# Patient Record
Sex: Male | Born: 2000 | Race: Black or African American | Hispanic: No | Marital: Single | Smoking: Current some day smoker
Health system: Southern US, Community
[De-identification: ages and names within clinical notes are randomized; demographics above are authoritative.]

## PROBLEM LIST (undated history)

## (undated) DIAGNOSIS — F909 Attention-deficit hyperactivity disorder, unspecified type: Secondary | ICD-10-CM

## (undated) HISTORY — PX: MOUTH SURGERY: SHX715

---
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
## 2020-01-16 DIAGNOSIS — S66115A Strain of flexor muscle, fascia and tendon of left ring finger at wrist and hand level, initial encounter: Secondary | ICD-10-CM | POA: Diagnosis not present

## 2020-03-30 ENCOUNTER — Ambulatory Visit: Payer: PPO | Attending: Orthopedic Surgery | Admitting: Occupational Therapy

## 2020-03-30 DIAGNOSIS — M25542 Pain in joints of left hand: Secondary | ICD-10-CM | POA: Insufficient documentation

## 2020-03-30 DIAGNOSIS — M25642 Stiffness of left hand, not elsewhere classified: Secondary | ICD-10-CM | POA: Insufficient documentation

## 2020-03-30 DIAGNOSIS — M6281 Muscle weakness (generalized): Secondary | ICD-10-CM | POA: Insufficient documentation

## 2020-03-30 DIAGNOSIS — R278 Other lack of coordination: Secondary | ICD-10-CM | POA: Insufficient documentation

## 2020-03-31 ENCOUNTER — Ambulatory Visit: Payer: PPO | Admitting: Occupational Therapy

## 2020-03-31 ENCOUNTER — Other Ambulatory Visit: Payer: Self-pay

## 2020-03-31 DIAGNOSIS — R278 Other lack of coordination: Secondary | ICD-10-CM

## 2020-03-31 DIAGNOSIS — M25542 Pain in joints of left hand: Secondary | ICD-10-CM | POA: Diagnosis present

## 2020-03-31 DIAGNOSIS — M25642 Stiffness of left hand, not elsewhere classified: Secondary | ICD-10-CM

## 2020-03-31 DIAGNOSIS — M6281 Muscle weakness (generalized): Secondary | ICD-10-CM | POA: Diagnosis present

## 2020-03-31 NOTE — Therapy (Signed)
[facility] [address] [place], [place], [address] Phone: [phone]   Fax:  [phone]  Occupational Therapy Evaluation  Patient Details  Name: Philip Lopez MRN: [id number] Date of Birth: 2000/12/20 Referring Provider (OT): Dr. Melvyn Novas   Encounter Date: 03/31/2020   OT End of Session - 03/31/20 1532    Visit Number 1    Number of Visits 16    Date for OT Re-Evaluation 05/29/20    Authorization Type [employer] MCD    OT Start Time 1320    OT Stop Time 1440    OT Time Calculation (min) 80 min    Activity Tolerance Patient tolerated treatment well    Behavior During Therapy [facility] for tasks assessed/performed           Past Medical History:  Diagnosis Date  . ADHD (attention deficit hyperactivity disorder)     Past Surgical History:  Procedure Laterality Date  . CIRCUMCISION  07/2016  . MOUTH SURGERY      There were no vitals filed for this visit.   Subjective Assessment - 03/31/20 1521    Pertinent History Lt ring FDP repair 03/15/20    Limitations per protocol    Currently in Pain? Yes    Pain Score 5     Pain Location Hand    Pain Orientation Left    Pain Descriptors / Indicators Sore    Pain Type Surgical pain;Acute pain    Pain Onset 1 to 4 weeks ago    Pain Frequency Intermittent    Aggravating Factors  movement    Pain Relieving Factors rest             [facility] OT Assessment - 03/31/20 0001      Assessment   Medical Diagnosis Lt ring FDP repair    Referring Provider (OT) Dr. Melvyn Novas    Onset Date/Surgical Date 03/15/20    Hand Dominance Right    Prior Therapy none      Precautions   Precautions Other (comment)    Precaution Comments per protocol    Required Braces or Orthoses Other Brace/Splint    Other Brace/Splint Dorsal block splint (DBS)      Home  Environment   Additional Comments Pt lives with roommate and attends [employer] but family close by    Lives With --   Roomate      Prior Function   Level of Independence Independent    Warden/ranger      ADL   Eating/Feeding Needs assist with cutting food    Grooming Set up    Upper Body Bathing Minimal assistance    Lower Body Bathing Minimal assistance    Upper Body Dressing Increased time    Lower Body Dressing Increased time    Toilet Transfer Independent    ADL comments has voice recognition software for typing      Written Expression   Dominant Hand Right    Handwriting --   no changes     Edema   Edema mild at Lt ring finger      ROM / Strength   AROM / PROM / Strength PROM      PROM   Overall PROM Comments WFL's w/ PROM in confines of splint                    OT Treatments/Exercises (OP) - 03/31/20 0001      ADLs   ADL Comments Pt arrived fully wrapped  and protected. Carefully removed soft cast and gauze wrapping (stitches removed yesterday at MD office). Cleaned hand/forearm and dried fully while preventing extension at MP's. Pt instructed in hand hygiene, precautions, and splint wear and care.      Exercises   Exercises --   Initial HEP within confines of splint - passive flexion and active extension     Splinting   Splinting Fabricated and fitted DBS per protocol with slight wrist extension, MP's at approx 60* flexion and IP's straight. Issued splint                 OT Education - 03/31/20 1424    Education Details hand hygiene, splint wear and care, precautions, initial HEP within confines of splint    Person(s) Educated Patient    Methods Explanation;Demonstration;Handout    Comprehension Verbalized understanding;Returned demonstration            OT Short Term Goals - 03/31/20 1537      OT SHORT TERM GOAL #1   Title Independent with splint wear and care    Baseline issued, may need adjustments    Time 4    Period Weeks    Status On-going      OT SHORT TERM GOAL #2   Title Independent with initial HEP    Baseline issued, will need updates per  protocol    Time 4    Period Weeks    Status On-going      OT SHORT TERM GOAL #3   Title Pt to demo approx 75% active composite flexion Rt ring finger    Baseline unable due to current precautions    Time 4    Period Weeks    Status New             OT Long Term Goals - 03/31/20 1539      OT LONG TERM GOAL #1   Title Independent with strengthening HEP    Baseline Dependent at this time d/t current precautions    Time 8    Period Weeks    Status New      OT LONG TERM GOAL #2   Title Pt to demo 90% full composite flexion Lt ring finger    Baseline dependent d/t current precautions    Time 8    Period Weeks    Status New      OT LONG TERM GOAL #3   Title Pt to use Lt hand as assist for all light bilateral tasks including typing    Baseline unable    Time 8    Period Weeks    Status New      OT LONG TERM GOAL #4   Title Pt to demo 30 lbs or greater grip strength Lt hand to assist with opening jars/containers    Baseline dependent d/t current precautions    Time 8    Period Weeks    Status New                 Plan - 03/31/20 1534    Clinical Impression Statement Pt is a 20 y.o. male who presents to OPOT s/p FDP repair of Lt ring finger on 03/15/20. Pt arrived fully wrapped and protected for evaluation, splinting, and initiation of HEP within confines of splint per protocol. Pt would benefit from O.T. to address deficits in ROM, pain, edema, coordination, and strength and improve functional use of Lt hand.    OT Occupational Profile and History Problem Focused Assessment -  Including review of records relating to presenting problem    Occupational performance deficits (Please refer to evaluation for details): ADL's;IADL's;Education    Body Structure / Function / Physical Skills ADL;Strength;Decreased knowledge of use of DME;Pain;Edema;UE functional use;IADL;ROM;Scar mobility;Coordination;Sensation;FMC;Decreased knowledge of precautions    Rehab Potential Good     Clinical Decision Making Several treatment options, min-mod task modification necessary    Comorbidities Affecting Occupational Performance: None    Modification or Assistance to Complete Evaluation  Min-Moderate modification of tasks or assist with assess necessary to complete eval    OT Frequency 2x / week    OT Duration 8 weeks    OT Treatment/Interventions Self-care/ADL training;Moist Heat;Fluidtherapy;DME and/or AE instruction;Splinting;Therapeutic activities;Compression bandaging;Ultrasound;Therapeutic exercise;Scar mobilization;Cryotherapy;Passive range of motion;Electrical Stimulation;Manual Therapy;Patient/family education    Plan progress as able per protocol, at 3 1/2 weeks post op add active flexion within splint    Consulted and Agree with Plan of Care Patient           Patient will benefit from skilled therapeutic intervention in order to improve the following deficits and impairments:   Body Structure / Function / Physical Skills: ADL,Strength,Decreased knowledge of use of DME,Pain,Edema,UE functional use,IADL,ROM,Scar mobility,Coordination,Sensation,FMC,Decreased knowledge of precautions     Managed medicaid CPT codes: 56433- Therapeutic Exercise, 97140 - Manual Therapy, 97530 - Therapeutic Activities, 97535 - Self Care, 97014 - Electrical stimulation (unattended), [id number] - Ultrasound, [id number] - Orthotic Fit, [id number] - Fluidotherapy, [id number] - Contrast bath and [id number] -  Paraffin     Visit Diagnosis: Stiffness of finger joint of left hand  Pain in joint of left hand  Muscle weakness (generalized)  Other lack of coordination    Problem List Patient Active Problem List   Diagnosis Date Noted  . STUTTERING 09/11/2009  . 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

## 2020-03-31 NOTE — Patient Instructions (Signed)
  WEARING SCHEDULE:  Wear splint at ALL times except for hygiene care (May remove finger strap only for exercises and then immediately place back on ONLY if directed by the therapist)  PURPOSE:  To prevent movement and for protection until injury can heal  CARE OF SPLINT:  Keep splint away from heat sources including: stove, radiator or furnace, or a car in sunlight. The splint can melt and will no longer fit you properly  Keep away from pets and children  Clean the splint with rubbing alcohol 1-2 times per day.  * During this time, make sure you also clean your hand/arm as instructed by your therapist and/or perform dressing changes as needed. Then dry hand/arm completely before replacing splint. (When cleaning hand/arm, keep it immobilized in same position until splint is replaced)  PRECAUTIONS/POTENTIAL PROBLEMS: *If you notice or experience increased pain, swelling, numbness, or a lingering reddened area from the splint: Contact your therapist immediately by calling [phone]. You must wear the splint for protection, but we will get you scheduled for adjustments as quickly as possible.  (If only straps or hooks need to be replaced and NO adjustments to the splint need to be made, just call the office ahead and let them know you are coming in)  If you have any medical concerns or signs of infection, please call your doctor immediately

## 2020-04-07 ENCOUNTER — Ambulatory Visit: Payer: PPO | Attending: Orthopedic Surgery | Admitting: Occupational Therapy

## 2020-04-07 ENCOUNTER — Other Ambulatory Visit: Payer: Self-pay

## 2020-04-07 DIAGNOSIS — M25542 Pain in joints of left hand: Secondary | ICD-10-CM | POA: Insufficient documentation

## 2020-04-07 DIAGNOSIS — M25642 Stiffness of left hand, not elsewhere classified: Secondary | ICD-10-CM | POA: Insufficient documentation

## 2020-04-07 NOTE — Therapy (Signed)
[facility] [address] [place], [place], [address] Phone: [phone]   Fax:  [phone]  Occupational Therapy Treatment  Patient Details  Name: Georgi Navarrete MRN: [id number] Date of Birth: 02/23/2001 Referring Provider (OT): Dr. Melvyn Novas   Encounter Date: 04/07/2020   OT End of Session - 04/07/20 1203    Visit Number 2    Number of Visits 16    Date for OT Re-Evaluation 05/29/20    Authorization Type [employer] MCD    OT Start Time 1100    OT Stop Time 1140    OT Time Calculation (min) 40 min    Activity Tolerance Patient tolerated treatment well    Behavior During Therapy [facility] for tasks assessed/performed           Past Medical History:  Diagnosis Date  . ADHD (attention deficit hyperactivity disorder)     Past Surgical History:  Procedure Laterality Date  . CIRCUMCISION  07/2016  . MOUTH SURGERY      There were no vitals filed for this visit.   Subjective Assessment - 04/07/20 1106    Pertinent History Lt ring FDP repair with grafting (using palmaris longus) 03/15/20    Limitations per protocol    Currently in Pain? Yes    Pain Score 6     Pain Location Hand    Pain Orientation Left    Pain Descriptors / Indicators Throbbing    Pain Type Acute pain;Surgical pain    Pain Onset 1 to 4 weeks ago    Pain Frequency Constant    Aggravating Factors  movement, cold    Pain Relieving Factors rest          Pt now 3 weeks and 2 days post-op therefore progressed protocol as indicated.  Reviewed previously issued ex's within confines of splint. Added active flexion and place and hold ex's within confines of splint today. Also added tenodesis ex w/ active wrist flexion allowing fingers to naturally open, and active wrist extension with passive finger flexion per 3-3.5 week post op protocol - however pt had to remove splint for latter ex d/t palmer bar of splint. Pt was cautioned NOT to actively extend wrist and  fingers but to curl fingers w/ active wrist ext.  Assessed wounds which are healing nicely and provided new stockinette. Reviewed protocol and precautions as pt was asking about working out - pt was informed this would not occur until week 12-14 and MD would need to clear him for this                      OT Education - 04/07/20 1135    Education Details additional ex's (active flexion in splint, tenodesis ex outside of splint)    Person(s) Educated Patient    Methods Explanation;Demonstration;Handout    Comprehension Verbalized understanding;Returned demonstration            OT Short Term Goals - 04/07/20 1204      OT SHORT TERM GOAL #1   Title Independent with splint wear and care    Baseline issued, may need adjustments    Time 4    Period Weeks    Status Achieved      OT SHORT TERM GOAL #2   Title Independent with initial HEP    Baseline issued, will need updates per protocol    Time 4    Period Weeks    Status On-going      OT SHORT  TERM GOAL #3   Title Pt to demo approx 75% active composite flexion Rt ring finger    Baseline unable due to current precautions    Time 4    Period Weeks    Status On-going             OT Long Term Goals - 03/31/20 1539      OT LONG TERM GOAL #1   Title Independent with strengthening HEP    Baseline Dependent at this time d/t current precautions    Time 8    Period Weeks    Status New      OT LONG TERM GOAL #2   Title Pt to demo 90% full composite flexion Lt ring finger    Baseline dependent d/t current precautions    Time 8    Period Weeks    Status New      OT LONG TERM GOAL #3   Title Pt to use Lt hand as assist for all light bilateral tasks including typing    Baseline unable    Time 8    Period Weeks    Status New      OT LONG TERM GOAL #4   Title Pt to demo 30 lbs or greater grip strength Lt hand to assist with opening jars/containers    Baseline dependent d/t current precautions    Time 8     Period Weeks    Status New                 Plan - 04/07/20 1204    Clinical Impression Statement Pt approximating remaining STG's. Pt progressing per protocol    OT Occupational Profile and History Problem Focused Assessment - Including review of records relating to presenting problem    Occupational performance deficits (Please refer to evaluation for details): ADL's;IADL's;Education    Body Structure / Function / Physical Skills ADL;Strength;Decreased knowledge of use of DME;Pain;Edema;UE functional use;IADL;ROM;Scar mobility;Coordination;Sensation;FMC;Decreased knowledge of precautions    Rehab Potential Good    Clinical Decision Making Several treatment options, min-mod task modification necessary    Comorbidities Affecting Occupational Performance: None    Modification or Assistance to Complete Evaluation  Min-Moderate modification of tasks or assist with assess necessary to complete eval    OT Frequency 2x / week    OT Duration 8 weeks    OT Treatment/Interventions Self-care/ADL training;Moist Heat;Fluidtherapy;DME and/or AE instruction;Splinting;Therapeutic activities;Compression bandaging;Ultrasound;Therapeutic exercise;Scar mobilization;Cryotherapy;Passive range of motion;Electrical Stimulation;Manual Therapy;Patient/family education    Plan review previous HEP, add ROM outside splint at 4-4.5 weeks post op, ultrasound    Consulted and Agree with Plan of Care Patient           Patient will benefit from skilled therapeutic intervention in order to improve the following deficits and impairments:   Body Structure / Function / Physical Skills: ADL,Strength,Decreased knowledge of use of DME,Pain,Edema,UE functional use,IADL,ROM,Scar mobility,Coordination,Sensation,FMC,Decreased knowledge of precautions       Visit Diagnosis: Stiffness of finger joint of left hand  Pain in joint of left hand    Problem List Patient Active Problem List   Diagnosis Date Noted  .  STUTTERING 09/11/2009  . 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 [place], [place], [address] Phone: [phone]   Fax:  [phone]  Name: Sheffield Hawker MRN: [id number] Date of Birth: Aug 03, 2000

## 2020-04-07 NOTE — Patient Instructions (Addendum)
As of 04/07/20:   Continue with previous ex's shown.  Perform below every 2 hours (approx 6 times per day), 10-20 reps each  1) INSIDE splint, you may begin making gentle fist on your own, then using other hand to stretch further into fist and then remove hand and gently hold position, then straighten fingers all the way back to splint  2) Remove splint to bend wrist forward and let fingers naturally open, then bring wrist back as you BEND fingers with other hand. Do NOT bend wrist and fingers back  (fingers most curl when bringing wrist back)

## 2020-04-12 ENCOUNTER — Ambulatory Visit: Payer: PPO | Admitting: Occupational Therapy

## 2020-04-19 ENCOUNTER — Other Ambulatory Visit: Payer: Self-pay

## 2020-04-19 ENCOUNTER — Ambulatory Visit: Payer: PPO | Admitting: Occupational Therapy

## 2020-04-19 DIAGNOSIS — M25642 Stiffness of left hand, not elsewhere classified: Secondary | ICD-10-CM | POA: Diagnosis not present

## 2020-04-19 NOTE — Therapy (Signed)
[place] [address] [place], [place], [address] Phone: [phone]   Fax:  [phone]  Occupational Therapy Treatment  Patient Details  Name: Philip Lopez MRN: [id number] Date of Birth: Feb 16, 2001 Referring Provider (OT): Dr. Caralyn Guile   Encounter Date: 04/19/2020   OT End of Session - 04/19/20 1413    Visit Number 3    Number of Visits 16    Date for OT Re-Evaluation 05/29/20    Authorization Type [employer] MCD    OT Start Time 1315    OT Stop Time 1400    OT Time Calculation (min) 45 min    Activity Tolerance Patient tolerated treatment well    Behavior During Therapy [facility] for tasks assessed/performed           Past Medical History:  Diagnosis Date  . ADHD (attention deficit hyperactivity disorder)     Past Surgical History:  Procedure Laterality Date  . CIRCUMCISION  07/2016  . MOUTH SURGERY      There were no vitals filed for this visit.   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 Yes    Pain Score 5     Pain Location Hand    Pain Orientation Left    Pain Type Acute pain    Pain Onset 1 to 4 weeks ago    Pain Frequency Intermittent    Aggravating Factors  cold, morning worse    Pain Relieving Factors movement, [place]            Ultrasound x 8 min to palm and along scar at 20% pulsed, 0.8 wts/cm2, 3 mhz for scar management and edema.  Pt shown scar massage and how to perform at home.  Progressed to A/ROM outside of splint per protocol as pt is now 5 weeks post-op (pt 1 week behind protocol since he did not come last week) - pt given buddy strap to strap ring to long finger when performing active flexion and extension for increased DIP activation in flexion, and better IP extension. Pt also continued active wrist flex/ext outside of splint but now can do full extension  of fingers.                     OT Education - 04/19/20 1357    Education Details Updates to HEP - A/ROM outside of splint, scar massage    Person(s) Educated Patient    Methods Explanation;Demonstration;Handout;Verbal cues    Comprehension Verbalized understanding;Returned demonstration            OT Short Term Goals - 04/19/20 1446      OT SHORT TERM GOAL #1   Title Independent with splint wear and care    Baseline issued, may need adjustments    Time 4    Period Weeks    Status Achieved      OT SHORT TERM GOAL #2   Title Independent with initial HEP    Baseline issued, will need updates per protocol    Time 4    Period Weeks    Status Achieved      OT SHORT TERM GOAL #3   Title Pt to demo approx 75% active composite flexion Rt ring finger    Baseline unable due to current precautions    Time 4    Period Weeks  Status Achieved             OT Long Term Goals - 03/31/20 1539      OT LONG TERM GOAL #1   Title Independent with strengthening HEP    Baseline Dependent at this time d/t current precautions    Time 8    Period Weeks    Status New      OT LONG TERM GOAL #2   Title Pt to demo 90% full composite flexion Lt ring finger    Baseline dependent d/t current precautions    Time 8    Period Weeks    Status New      OT LONG TERM GOAL #3   Title Pt to use Lt hand as assist for all light bilateral tasks including typing    Baseline unable    Time 8    Period Weeks    Status New      OT LONG TERM GOAL #4   Title Pt to demo 30 lbs or greater grip strength Lt hand to assist with opening jars/containers    Baseline dependent d/t current precautions    Time 8    Period Weeks    Status New                 Plan - 04/19/20 1447    Clinical Impression Statement Pt has met STG's. Pt progressing with protocol, however missed last week so slightly behind schedule.    OT Occupational Profile and History Problem Focused Assessment -  Including review of records relating to presenting problem    Occupational performance deficits (Please refer to evaluation for details): ADL's;IADL's;Education    Body Structure / Function / Physical Skills ADL;Strength;Decreased knowledge of use of DME;Pain;Edema;UE functional use;IADL;ROM;Scar mobility;Coordination;Sensation;FMC;Decreased knowledge of precautions    Rehab Potential Good    Clinical Decision Making Several treatment options, min-mod task modification necessary    Comorbidities Affecting Occupational Performance: None    Modification or Assistance to Complete Evaluation  Min-Moderate modification of tasks or assist with assess necessary to complete eval    OT Frequency 2x / week    OT Duration 8 weeks    OT Treatment/Interventions Self-care/ADL training;Moist Heat;Fluidtherapy;DME and/or AE instruction;Splinting;Therapeutic activities;Compression bandaging;Ultrasound;Therapeutic exercise;Scar mobilization;Cryotherapy;Passive range of motion;Electrical Stimulation;Manual Therapy;Patient/family education    Plan continue [place] and A/ROM outside of splint, begin NMES w/ buddy strap    Consulted and Agree with Plan of Care Patient           Patient will benefit from skilled therapeutic intervention in order to improve the following deficits and impairments:   Body Structure / Function / Physical Skills: ADL,Strength,Decreased knowledge of use of DME,Pain,Edema,UE functional use,IADL,ROM,Scar mobility,Coordination,Sensation,FMC,Decreased knowledge of precautions       Visit Diagnosis: Stiffness of finger joint of left hand    Problem List Patient Active Problem List   Diagnosis Date Noted  . STUTTERING 09/11/2009  . 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 [place], [place], [address] Phone: [phone]   Fax:  [phone]  Name: Chip Canepa MRN: [id number] Date of Birth: [birth year]

## 2020-04-19 NOTE — Patient Instructions (Signed)
Scar massage: 2x/day for 5 min   Without lotion: lift skin up and do a cross friction massage for 1/2 the time  Then use a small amount of cocoa butter or vitamin E cream and apply and massage along incisions in circular motion   Can now remove splint to actively make a fist and straighten hand all the way - do 10 slow reps, 4-6 times per day. Recommend doing with buddy strap on (ring to long finger)  Also continue to bend wrist up and down

## 2020-04-21 ENCOUNTER — Ambulatory Visit: Payer: PPO | Admitting: Occupational Therapy

## 2020-04-21 ENCOUNTER — Other Ambulatory Visit: Payer: Self-pay

## 2020-04-21 DIAGNOSIS — M25642 Stiffness of left hand, not elsewhere classified: Secondary | ICD-10-CM | POA: Diagnosis not present

## 2020-04-21 NOTE — Therapy (Signed)
[facility] [address] [place], [place], [address] Phone: [phone]   Fax:  [phone]  Occupational Therapy Treatment  Patient Details  Name: Philip Lopez MRN: [id number] Date of Birth: Sep 18, 2000 Referring Provider (OT): Dr. Melvyn Novas   Encounter Date: 04/21/2020   OT End of Session - 04/21/20 1136    Visit Number 4    Number of Visits 16    Date for OT Re-Evaluation 05/29/20    Authorization Type [employer] MCD    OT Start Time 1110   Pt arrived 10 min late   OT Stop Time 1145    OT Time Calculation (min) 35 min    Activity Tolerance Patient tolerated treatment well    Behavior During Therapy [facility] for tasks assessed/performed           Past Medical History:  Diagnosis Date  . ADHD (attention deficit hyperactivity disorder)     Past Surgical History:  Procedure Laterality Date  . CIRCUMCISION  07/2016  . MOUTH SURGERY      There were no vitals filed for this visit.   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 No/denies    Pain Onset 1 to 4 weeks ago           Ultrasound x 8 min. To incision area in palm and base of ring finger, 3 Mhz, 20% pulsed, 0.8 wts/cm2 for scar tissue and edema control.  Active flex/ext Lt hand, followed by active flexion, place and hold, MP extension followed by IP extension.  NMES X 10 min to finger flexors at 50 pps, 250 pw, 10 sec on/off cycle. Therapist had to adjust electrode placement to get finger activation (initially only getting wrist flex) w/ proximal electrode more ulnarly and distal electrode more radially                       OT Short Term Goals - 04/19/20 1446      OT SHORT TERM GOAL #1   Title Independent with splint wear and care    Baseline issued, may need  adjustments    Time 4    Period Weeks    Status Achieved      OT SHORT TERM GOAL #2   Title Independent with initial HEP    Baseline issued, will need updates per protocol    Time 4    Period Weeks    Status Achieved      OT SHORT TERM GOAL #3   Title Pt to demo approx 75% active composite flexion Rt ring finger    Baseline unable due to current precautions    Time 4    Period Weeks    Status Achieved             OT Long Term Goals - 03/31/20 1539      OT LONG TERM GOAL #1   Title Independent with strengthening HEP    Baseline Dependent at this time d/t current precautions    Time 8    Period Weeks    Status New      OT LONG TERM GOAL #2   Title Pt to demo 90% full composite flexion Lt ring finger    Baseline dependent d/t current precautions    Time 8  Period Weeks    Status New      OT LONG TERM GOAL #3   Title Pt to use Lt hand as assist for all light bilateral tasks including typing    Baseline unable    Time 8    Period Weeks    Status New      OT LONG TERM GOAL #4   Title Pt to demo 30 lbs or greater grip strength Lt hand to assist with opening jars/containers    Baseline dependent d/t current precautions    Time 8    Period Weeks    Status New                 Plan - 04/21/20 1137    Clinical Impression Statement Pt progressing with motion - pt unable to get DIP flexion of ring finger unless buddy strapped to long finger.    OT Occupational Profile and History Problem Focused Assessment - Including review of records relating to presenting problem    Occupational performance deficits (Please refer to evaluation for details): ADL's;IADL's;Education    Body Structure / Function / Physical Skills ADL;Strength;Decreased knowledge of use of DME;Pain;Edema;UE functional use;IADL;ROM;Scar mobility;Coordination;Sensation;FMC;Decreased knowledge of precautions    Rehab Potential Good    Clinical Decision Making Several treatment options, min-mod task  modification necessary    Comorbidities Affecting Occupational Performance: None    Modification or Assistance to Complete Evaluation  Min-Moderate modification of tasks or assist with assess necessary to complete eval    OT Frequency 2x / week    OT Duration 8 weeks    OT Treatment/Interventions Self-care/ADL training;Moist Heat;Fluidtherapy;DME and/or AE instruction;Splinting;Therapeutic activities;Compression bandaging;Ultrasound;Therapeutic exercise;Scar mobilization;Cryotherapy;Passive range of motion;Electrical Stimulation;Manual Therapy;Patient/family education    Plan progress to 6 week post op protocol    Consulted and Agree with Plan of Care Patient           Patient will benefit from skilled therapeutic intervention in order to improve the following deficits and impairments:   Body Structure / Function / Physical Skills: ADL,Strength,Decreased knowledge of use of DME,Pain,Edema,UE functional use,IADL,ROM,Scar mobility,Coordination,Sensation,FMC,Decreased knowledge of precautions       Visit Diagnosis: Stiffness of finger joint of left hand    Problem List Patient Active Problem List   Diagnosis Date Noted  . STUTTERING 09/11/2009  . TINEA CAPITIS 06/04/2007    Kelli Churn, OTR/L 04/21/2020, 11:40 AM  [facility] [address] [place], [place], [address] Phone: [phone]   Fax:  [phone]  Name: Philip Lopez MRN: [id number] Date of Birth: [birth year]

## 2020-04-28 ENCOUNTER — Ambulatory Visit: Payer: PPO | Admitting: Occupational Therapy

## 2020-04-29 ENCOUNTER — Ambulatory Visit: Payer: PPO | Admitting: Occupational Therapy

## 2020-04-29 ENCOUNTER — Other Ambulatory Visit: Payer: Self-pay

## 2020-04-29 DIAGNOSIS — M25542 Pain in joints of left hand: Secondary | ICD-10-CM

## 2020-04-29 DIAGNOSIS — M25642 Stiffness of left hand, not elsewhere classified: Secondary | ICD-10-CM

## 2020-04-29 NOTE — Patient Instructions (Signed)
AROM: PIP Flexion / Extension    Pinch bottom knuckle of ____ring____ finger of left hand to prevent bending. Actively bend middle knuckle until stretch is felt. Hold _5___ seconds. Relax. Straighten finger as far as possible. Repeat __10__ times per set.  Do __4__ sessions per day.  DIP Flexion (Active Blocked)    Hold ___RING___ finger firmly at the middle so that only the tip joint can bend. Hold _5___ seconds. Repeat _10___ times. Do _4___ sessions per day.  PIP Extension (Passive    Use thumb of other hand on top of joint and two fingers under- neath on either side to straighten middle joint of __RING____ finger. Hold _10___ seconds. Repeat __5__ times. Do __4__ sessions per day. DO GENTLY AND MAKE SURE WRIST IS NEUTRAL OR SLIGHTLY BENT DOWN, NOT BACK/UP   **MAY REMOVE SPLINT AT HOME AND WEAR BUDDY STRAP (RING TO MIDDLE FINGER), BUT CONTINUE TO WEAR SPLINT WHEN OUT IN PUBLIC. WEAR BUDDY STRAP WHEN AT HOME - MAY REMOVE TO SHOWER, WASH HANDS, WASH DISHES, AND REMOVE FOR ABOVE 3 EXERCISES, BUT PLACE BACK ON FOR PREVIOUSLY ISSUED EX'S AND WHILE HOME.

## 2020-04-29 NOTE — Therapy (Signed)
[facility] [address] [place], [place], [address] Phone: [phone]   Fax:  [phone]  Occupational Therapy Treatment  Patient Details  Name: Philip Lopez MRN: [id number] Date of Birth: 2000-07-23 Referring Provider (OT): Dr. Melvyn Novas   Encounter Date: 04/29/2020   OT End of Session - 04/29/20 1221    Visit Number 5    Number of Visits 16    Date for OT Re-Evaluation 05/29/20    Authorization Type U[employer] MCD    OT Start Time 1018    OT Stop Time 1100    OT Time Calculation (min) 42 min    Activity Tolerance Patient tolerated treatment well    Behavior During Therapy [facility] for tasks assessed/performed           Past Medical History:  Diagnosis Date  . ADHD (attention deficit hyperactivity disorder)     Past Surgical History:  Procedure Laterality Date  . CIRCUMCISION  07/2016  . MOUTH SURGERY      There were no vitals filed for this visit.   Ultrasound over incision areas at wrist, palm and fingers x 8 min, 3 Mhz, 20% pulsed, 0.8 wts/cm2 for scar tissue and edema control.  Progressed per 6 week post-op protocol (pt now 7 weeks post-op but missed 1 week of therapy) - pt issued blocking ex's for PIP joint and DIP joint ring finger and gentle passive extension of ring finger - see pt instructions for details. Pt demo each. Pt to now only wear dorsal block splint when out in public but to wear buddy strap when home except for when showering, washing hands, and performing 3 ex's issued today. Pt can wear buddy strap for previously issued ex's (composite flexion, IP flex w/ all digits, etc). Pt verbalized understanding. Therapist reviewed remaining precautions - including no strengthening (pulling, pushing, lifting, wt bearing) and no forced passive extension of finger w/ wrist extension.  NMES to finger flexors (previous parameters) with proximal electrode more ulnar volar forearm and distal electrode more radial  volar forearm for best activation of finger flexion (with buddy strap ring to long finger)                      OT Education - 04/29/20 1056    Education Details Updates to HEP including blocking ex's, passive extension    Person(s) Educated Patient    Methods Explanation;Demonstration;Handout;Verbal cues    Comprehension Verbalized understanding;Returned demonstration            OT Short Term Goals - 04/19/20 1446      OT SHORT TERM GOAL #1   Title Independent with splint wear and care    Baseline issued, may need adjustments    Time 4    Period Weeks    Status Achieved      OT SHORT TERM GOAL #2   Title Independent with initial HEP    Baseline issued, will need updates per protocol    Time 4    Period Weeks    Status Achieved      OT SHORT TERM GOAL #3   Title Pt to demo approx 75% active composite flexion Rt ring finger    Baseline unable due to current precautions    Time 4    Period Weeks    Status Achieved             OT Long Term Goals - 04/29/20 1221      OT LONG TERM  GOAL #1   Title Independent with strengthening HEP    Baseline Dependent at this time d/t current precautions    Time 8    Period Weeks    Status New      OT LONG TERM GOAL #2   Title Pt to demo 90% full composite flexion Lt ring finger    Baseline dependent d/t current precautions    Time 8    Period Weeks    Status On-going      OT LONG TERM GOAL #3   Title Pt to use Lt hand as assist for all light bilateral tasks including typing    Baseline unable    Time 8    Period Weeks    Status New      OT LONG TERM GOAL #4   Title Pt to demo 30 lbs or greater grip strength Lt hand to assist with opening jars/containers    Baseline dependent d/t current precautions    Time 8    Period Weeks    Status New                 Plan - 04/29/20 1222    Clinical Impression Statement Pt progressing per protocol - pt now getting minimal DIP flexion of ring finger  during blocking ex's w/o buddy strapping to long finger    OT Occupational Profile and History Problem Focused Assessment - Including review of records relating to presenting problem    Occupational performance deficits (Please refer to evaluation for details): ADL's;IADL's;Education    Body Structure / Function / Physical Skills ADL;Strength;Decreased knowledge of use of DME;Pain;Edema;UE functional use;IADL;ROM;Scar mobility;Coordination;Sensation;FMC;Decreased knowledge of precautions    Rehab Potential Good    Clinical Decision Making Several treatment options, min-mod task modification necessary    Comorbidities Affecting Occupational Performance: None    Modification or Assistance to Complete Evaluation  Min-Moderate modification of tasks or assist with assess necessary to complete eval    OT Frequency 2x / week    OT Duration 8 weeks    OT Treatment/Interventions Self-care/ADL training;Moist Heat;Fluidtherapy;DME and/or AE instruction;Splinting;Therapeutic activities;Compression bandaging;Ultrasound;Therapeutic exercise;Scar mobilization;Cryotherapy;Passive range of motion;Electrical Stimulation;Manual Therapy;Patient/family education    Plan pm extension splint if needed, continue A/ROM, P/ROM, blocking ex's, passive extension and modalities prn    Consulted and Agree with Plan of Care Patient           Patient will benefit from skilled therapeutic intervention in order to improve the following deficits and impairments:   Body Structure / Function / Physical Skills: ADL,Strength,Decreased knowledge of use of DME,Pain,Edema,UE functional use,IADL,ROM,Scar mobility,Coordination,Sensation,FMC,Decreased knowledge of precautions       Visit Diagnosis: Stiffness of finger joint of left hand  Pain in joint of left hand    Problem List Patient Active Problem List   Diagnosis Date Noted  . STUTTERING 09/11/2009  . TINEA CAPITIS 06/04/2007    Kelli Churn,  OTR/L 04/29/2020, 12:24 PM  [facility] [address] [place], [place], [address] Phone: [phone]   Fax:  [phone]  Name: Philip Lopez MRN: [id number] Date of Birth: 2001-02-14

## 2020-05-03 ENCOUNTER — Ambulatory Visit: Payer: PPO | Admitting: Occupational Therapy

## 2020-05-05 ENCOUNTER — Ambulatory Visit: Payer: PPO | Attending: Orthopedic Surgery | Admitting: Occupational Therapy

## 2020-05-05 ENCOUNTER — Other Ambulatory Visit: Payer: Self-pay

## 2020-05-05 DIAGNOSIS — R278 Other lack of coordination: Secondary | ICD-10-CM

## 2020-05-05 DIAGNOSIS — M25642 Stiffness of left hand, not elsewhere classified: Secondary | ICD-10-CM | POA: Diagnosis present

## 2020-05-05 DIAGNOSIS — M25542 Pain in joints of left hand: Secondary | ICD-10-CM | POA: Diagnosis present

## 2020-05-05 DIAGNOSIS — M6281 Muscle weakness (generalized): Secondary | ICD-10-CM | POA: Insufficient documentation

## 2020-05-05 NOTE — Therapy (Signed)
[facility] [address] [place], [place], [address] Phone: [phone]   Fax:  [phone]  Occupational Therapy Treatment  Patient Details  Name: Philip Lopez MRN: [id number] Date of Birth: [birth year] Referring Provider (OT): Dr. Melvyn Novas   Encounter Date: 05/05/2020   OT End of Session - 05/05/20 1047    Visit Number 6    Number of Visits 16    Date for OT Re-Evaluation 05/29/20    Authorization Type UHC MCD    OT Start Time 1018    OT Stop Time 1103    OT Time Calculation (min) 45 min    Activity Tolerance Patient tolerated treatment well    Behavior During Therapy [facility] for tasks assessed/performed           Past Medical History:  Diagnosis Date  . ADHD (attention deficit hyperactivity disorder)     Past Surgical History:  Procedure Laterality Date  . CIRCUMCISION  07/2016  . MOUTH SURGERY      There were no vitals filed for this visit.   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 No/denies    Pain Onset 1 to 4 weeks ago           Pt progressing with motion - continued A/ROM, P/ROM, place and hold, and blocking ex's. Pt now has full finger extension Lt ring finger.   Continued composite flex to MP extension followed by IP extension w/ buddy strap on.  Pt issued red foam for finger rolling ex (unable to do w/ tan foam) NMES x 12 min to finger flexors (previous parameters)                        OT Short Term Goals - 04/19/20 1446      OT SHORT TERM GOAL #1   Title Independent with splint wear and care    Baseline issued, may need adjustments    Time 4    Period Weeks    Status Achieved      OT SHORT TERM GOAL #2   Title Independent with initial HEP    Baseline issued, will need updates per  protocol    Time 4    Period Weeks    Status Achieved      OT SHORT TERM GOAL #3   Title Pt to demo approx 75% active composite flexion Rt ring finger    Baseline unable due to current precautions    Time 4    Period Weeks    Status Achieved             OT Long Term Goals - 04/29/20 1221      OT LONG TERM GOAL #1   Title Independent with strengthening HEP    Baseline Dependent at this time d/t current precautions    Time 8    Period Weeks    Status New      OT LONG TERM GOAL #2   Title Pt to demo 90% full composite flexion Lt ring finger    Baseline dependent d/t current precautions    Time 8    Period Weeks    Status On-going      OT LONG TERM GOAL #3   Title Pt to use Lt hand  as assist for all light bilateral tasks including typing    Baseline unable    Time 8    Period Weeks    Status New      OT LONG TERM GOAL #4   Title Pt to demo 30 lbs or greater grip strength Lt hand to assist with opening jars/containers    Baseline dependent d/t current precautions    Time 8    Period Weeks    Status New                 Plan - 05/05/20 1113    Clinical Impression Statement Pt progressing with finger ROM - minimal at DIP joint. Pt no longer has PIP extensor lag and therefore does not need pm extension splint    Occupational performance deficits (Please refer to evaluation for details): ADL's;IADL's;Education    Body Structure / Function / Physical Skills ADL;Strength;Decreased knowledge of use of DME;Pain;Edema;UE functional use;IADL;ROM;Scar mobility;Coordination;Sensation;FMC;Decreased knowledge of precautions    OT Frequency 2x / week    OT Duration 8 weeks    OT Treatment/Interventions Self-care/ADL training;Moist Heat;Fluidtherapy;DME and/or AE instruction;Splinting;Therapeutic activities;Compression bandaging;Ultrasound;Therapeutic exercise;Scar mobilization;Cryotherapy;Passive range of motion;Electrical Stimulation;Manual Therapy;Patient/family education     Plan begin strengthening with putty next visit as pt will be 8 weeks post-op   Consulted and Agree with Plan of Care Patient           Patient will benefit from skilled therapeutic intervention in order to improve the following deficits and impairments:   Body Structure / Function / Physical Skills: ADL,Strength,Decreased knowledge of use of DME,Pain,Edema,UE functional use,IADL,ROM,Scar mobility,Coordination,Sensation,FMC,Decreased knowledge of precautions       Visit Diagnosis: Stiffness of finger joint of left hand  Pain in joint of left hand  Other lack of coordination    Problem List Patient Active Problem List   Diagnosis Date Noted  . STUTTERING 09/11/2009  . TINEA CAPITIS 06/04/2007    Kelli Churn, OTR/L 05/05/2020, 11:15 AM  [facility] [address] [place], [place], [address] Phone: [phone]   Fax:  [phone]  Name: Philip Lopez MRN: [id number] Date of Birth: [birth year]

## 2020-05-10 ENCOUNTER — Other Ambulatory Visit: Payer: Self-pay

## 2020-05-10 ENCOUNTER — Ambulatory Visit: Payer: PPO | Admitting: Occupational Therapy

## 2020-05-10 DIAGNOSIS — M25642 Stiffness of left hand, not elsewhere classified: Secondary | ICD-10-CM | POA: Diagnosis not present

## 2020-05-10 DIAGNOSIS — R278 Other lack of coordination: Secondary | ICD-10-CM

## 2020-05-10 DIAGNOSIS — M6281 Muscle weakness (generalized): Secondary | ICD-10-CM

## 2020-05-10 NOTE — Patient Instructions (Signed)
  1. Grip Strengthening (Resistive Putty)   Squeeze putty using thumb and all fingers. Repeat _20___ times. Do __2__ sessions per day. Use red putty   2. IP Fisting (Resistive Putty)    Keeping big knuckles straight, bend fingertips to squeeze putty. Repeat __10__ times. Do __2__ sessions per day. Use red putty    3. Roll putty into tube on table and pinch between each finger and thumb x 10 reps each, 2 sessions per day. (Do ring and small finger together). Use yellow putty
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
## 2020-05-12 ENCOUNTER — Ambulatory Visit: Payer: PPO | Admitting: Occupational Therapy

## 2020-05-12 ENCOUNTER — Other Ambulatory Visit: Payer: Self-pay

## 2020-05-12 DIAGNOSIS — M6281 Muscle weakness (generalized): Secondary | ICD-10-CM

## 2020-05-12 DIAGNOSIS — M25642 Stiffness of left hand, not elsewhere classified: Secondary | ICD-10-CM

## 2020-05-12 DIAGNOSIS — R278 Other lack of coordination: Secondary | ICD-10-CM
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
## 2020-05-17 ENCOUNTER — Ambulatory Visit: Payer: PPO | Admitting: Occupational Therapy

## 2020-05-19 ENCOUNTER — Ambulatory Visit: Payer: PPO | Admitting: Occupational Therapy

## 2020-05-24 ENCOUNTER — Encounter: Payer: PPO | Admitting: Occupational Therapy

## 2020-05-26 ENCOUNTER — Ambulatory Visit: Payer: PPO | Admitting: Occupational Therapy

## 2020-05-31 ENCOUNTER — Encounter: Payer: PPO | Admitting: Occupational Therapy

## 2020-06-02 ENCOUNTER — Ambulatory Visit: Payer: PPO | Admitting: Occupational Therapy

## 2020-06-07 ENCOUNTER — Encounter: Payer: PPO | Admitting: Occupational Therapy

## 2020-06-09 ENCOUNTER — Encounter: Payer: PPO | Admitting: Occupational Therapy

## 2020-06-16 ENCOUNTER — Ambulatory Visit: Payer: PPO | Attending: Orthopedic Surgery | Admitting: Occupational Therapy

## 2020-06-16 ENCOUNTER — Encounter: Payer: PPO | Admitting: Occupational Therapy

## 2020-06-16 DIAGNOSIS — J029 Acute pharyngitis, unspecified: Secondary | ICD-10-CM | POA: Diagnosis not present

## 2020-06-16 DIAGNOSIS — Z113 Encounter for screening for infections with a predominantly sexual mode of transmission: Secondary | ICD-10-CM | POA: Diagnosis not present

## 2021-06-16 DIAGNOSIS — Z202 Contact with and (suspected) exposure to infections with a predominantly sexual mode of transmission: Secondary | ICD-10-CM | POA: Diagnosis not present

## 2021-12-02 DIAGNOSIS — Z202 Contact with and (suspected) exposure to infections with a predominantly sexual mode of transmission: Secondary | ICD-10-CM | POA: Diagnosis not present

## 2022-01-03 DIAGNOSIS — B349 Viral infection, unspecified: Secondary | ICD-10-CM | POA: Diagnosis not present

## 2022-02-16 ENCOUNTER — Emergency Department (HOSPITAL_COMMUNITY): Payer: Medicaid Other

## 2022-02-16 ENCOUNTER — Encounter (HOSPITAL_COMMUNITY): Payer: Self-pay | Admitting: *Deleted

## 2022-02-16 ENCOUNTER — Emergency Department (HOSPITAL_COMMUNITY)
Admission: EM | Admit: 2022-02-16 | Discharge: 2022-02-16 | Disposition: A | Discharge status: Home / Self Care | Payer: Medicaid Other | Attending: Emergency Medicine | Admitting: Emergency Medicine

## 2022-02-16 ENCOUNTER — Other Ambulatory Visit (HOSPITAL_COMMUNITY): Payer: Self-pay

## 2022-02-16 ENCOUNTER — Other Ambulatory Visit: Payer: Self-pay

## 2022-02-16 DIAGNOSIS — S3991XA Unspecified injury of abdomen, initial encounter: Secondary | ICD-10-CM | POA: Insufficient documentation

## 2022-02-16 DIAGNOSIS — R2241 Localized swelling, mass and lump, right lower limb: Secondary | ICD-10-CM | POA: Diagnosis not present

## 2022-02-16 DIAGNOSIS — M7989 Other specified soft tissue disorders: Secondary | ICD-10-CM | POA: Diagnosis not present

## 2022-02-16 DIAGNOSIS — S838X1A Sprain of other specified parts of right knee, initial encounter: Secondary | ICD-10-CM | POA: Diagnosis not present

## 2022-02-16 DIAGNOSIS — S79921A Unspecified injury of right thigh, initial encounter: Secondary | ICD-10-CM | POA: Diagnosis present

## 2022-02-16 DIAGNOSIS — S3993XA Unspecified injury of pelvis, initial encounter: Secondary | ICD-10-CM | POA: Diagnosis not present

## 2022-02-16 DIAGNOSIS — S71101A Unspecified open wound, right thigh, initial encounter: Secondary | ICD-10-CM | POA: Diagnosis not present

## 2022-02-16 DIAGNOSIS — W3400XA Accidental discharge from unspecified firearms or gun, initial encounter: Secondary | ICD-10-CM | POA: Diagnosis not present

## 2022-02-16 DIAGNOSIS — Z23 Encounter for immunization: Secondary | ICD-10-CM | POA: Insufficient documentation

## 2022-02-16 DIAGNOSIS — Y249XXA Unspecified firearm discharge, undetermined intent, initial encounter: Secondary | ICD-10-CM

## 2022-02-16 DIAGNOSIS — R262 Difficulty in walking, not elsewhere classified: Secondary | ICD-10-CM | POA: Diagnosis not present

## 2022-02-16 DIAGNOSIS — S30813A Abrasion of scrotum and testes, initial encounter: Secondary | ICD-10-CM | POA: Insufficient documentation

## 2022-02-16 DIAGNOSIS — S31000A Unspecified open wound of lower back and pelvis without penetration into retroperitoneum, initial encounter: Secondary | ICD-10-CM | POA: Diagnosis not present

## 2022-02-16 DIAGNOSIS — S71131A Puncture wound without foreign body, right thigh, initial encounter: Secondary | ICD-10-CM | POA: Diagnosis not present

## 2022-02-16 DIAGNOSIS — I861 Scrotal varices: Secondary | ICD-10-CM | POA: Diagnosis not present

## 2022-02-16 HISTORY — DX: Accidental discharge from unspecified firearms or gun, initial encounter: W34.00XA

## 2022-02-16 LAB — BASIC METABOLIC PANEL
Anion gap: 13 (ref 5–15)
BUN: 14 mg/dL (ref 6–20)
CO2: 21 mmol/L — ABNORMAL LOW (ref 22–32)
Calcium: 9.1 mg/dL (ref 8.9–10.3)
Chloride: 107 mmol/L (ref 98–111)
Creatinine, Ser: 1.48 mg/dL — ABNORMAL HIGH (ref 0.61–1.24)
GFR, Estimated: >60 mL/min (ref >60)
Glucose, Bld: 130 mg/dL — ABNORMAL HIGH (ref 70–99)
Potassium: 3.6 mmol/L (ref 3.5–5.1)
Sodium: 141 mmol/L (ref 135–145)

## 2022-02-16 LAB — I-STAT CHEM 8, ED
BUN: 16 mg/dL (ref 6–20)
Calcium, Ion: 1.13 mmol/L — ABNORMAL LOW (ref 1.15–1.40)
Chloride: 104 mmol/L (ref 98–111)
Creatinine, Ser: 1.5 mg/dL — ABNORMAL HIGH (ref 0.61–1.24)
Glucose, Bld: 118 mg/dL — ABNORMAL HIGH (ref 70–99)
HCT: 48 % (ref 39.0–52.0)
Hemoglobin: 16.3 g/dL (ref 13.0–17.0)
Potassium: 3.5 mmol/L (ref 3.5–5.1)
Sodium: 143 mmol/L (ref 135–145)
TCO2: 23 mmol/L (ref 22–32)

## 2022-02-16 LAB — CBC WITH DIFFERENTIAL/PLATELET
Abs Immature Granulocytes: 0.03 10*3/uL (ref 0.00–0.07)
Basophils Absolute: 0 10*3/uL (ref 0.0–0.1)
Basophils Relative: 1 %
Eosinophils Absolute: 0.2 10*3/uL (ref 0.0–0.5)
Eosinophils Relative: 2 %
HCT: 46.4 % (ref 39.0–52.0)
Hemoglobin: 16.5 g/dL (ref 13.0–17.0)
Immature Granulocytes: 0 %
Lymphocytes Relative: 49 %
Lymphs Abs: 4.2 10*3/uL — ABNORMAL HIGH (ref 0.7–4.0)
MCH: 31.3 pg (ref 26.0–34.0)
MCHC: 35.6 g/dL (ref 30.0–36.0)
MCV: 88 fL (ref 80.0–100.0)
Monocytes Absolute: 0.8 10*3/uL (ref 0.1–1.0)
Monocytes Relative: 9 %
Neutro Abs: 3.3 10*3/uL (ref 1.7–7.7)
Neutrophils Relative %: 39 %
Platelets: 261 10*3/uL (ref 150–400)
RBC: 5.27 MIL/uL (ref 4.22–5.81)
RDW: 12.5 % (ref 11.5–15.5)
WBC: 8.5 10*3/uL (ref 4.0–10.5)
nRBC: 0 % (ref 0.0–0.2)

## 2022-02-16 MED ORDER — CEPHALEXIN 500 MG PO CAPS
500.0000 mg | ORAL_CAPSULE | Freq: Two times a day (BID) | ORAL | 0 refills | Status: AC
  Filled 2022-02-16: qty 14, 7d supply, fill #0

## 2022-02-16 MED ORDER — ACETAMINOPHEN 500 MG PO TABS
1000.0000 mg | ORAL_TABLET | Freq: Once | ORAL | Status: AC
  Administered 2022-02-16: 1000 mg via ORAL
  Filled 2022-02-16: qty 2

## 2022-02-16 MED ORDER — MORPHINE SULFATE (PF) 4 MG/ML IV SOLN
4.0000 mg | Freq: Once | INTRAVENOUS | Status: AC
  Administered 2022-02-16: 4 mg via INTRAVENOUS
  Filled 2022-02-16: qty 1

## 2022-02-16 MED ORDER — OXYCODONE HCL 5 MG PO TABS
5.0000 mg | ORAL_TABLET | Freq: Four times a day (QID) | ORAL | 0 refills | Status: AC | PRN
  Filled 2022-02-16: qty 20, 5d supply, fill #0

## 2022-02-16 MED ORDER — CYCLOBENZAPRINE HCL 10 MG PO TABS
10.0000 mg | ORAL_TABLET | Freq: Once | ORAL | Status: AC
  Administered 2022-02-16: 10 mg via ORAL
  Filled 2022-02-16: qty 1

## 2022-02-16 MED ORDER — IBUPROFEN 800 MG PO TABS
800.0000 mg | ORAL_TABLET | Freq: Three times a day (TID) | ORAL | 0 refills | Status: AC
  Filled 2022-02-16: qty 21, 7d supply, fill #0

## 2022-02-16 MED ORDER — CYCLOBENZAPRINE HCL 10 MG PO TABS
10.0000 mg | ORAL_TABLET | Freq: Two times a day (BID) | ORAL | 0 refills | Status: AC | PRN
  Filled 2022-02-16: qty 20, 10d supply, fill #0

## 2022-02-16 MED ORDER — FENTANYL CITRATE (PF) 100 MCG/2ML IJ SOLN
INTRAMUSCULAR | Status: AC
  Administered 2022-02-16: 100 ug
  Filled 2022-02-16: qty 2

## 2022-02-16 MED ORDER — KETOROLAC TROMETHAMINE 30 MG/ML IJ SOLN
30.0000 mg | Freq: Once | INTRAMUSCULAR | Status: AC
  Administered 2022-02-16: 30 mg via INTRAVENOUS
  Filled 2022-02-16: qty 1

## 2022-02-16 MED ORDER — ONDANSETRON HCL 4 MG/2ML IJ SOLN
4.0000 mg | Freq: Once | INTRAMUSCULAR | Status: AC
  Administered 2022-02-16: 4 mg via INTRAVENOUS
  Filled 2022-02-16: qty 2

## 2022-02-16 MED ORDER — OXYCODONE HCL 5 MG PO TABS
10.0000 mg | ORAL_TABLET | Freq: Once | ORAL | Status: AC
  Administered 2022-02-16: 10 mg via ORAL
  Filled 2022-02-16: qty 2

## 2022-02-16 MED ORDER — IOHEXOL 350 MG/ML SOLN
75.0000 mL | Freq: Once | INTRAVENOUS | Status: AC | PRN
  Administered 2022-02-16: 75 mL via INTRAVENOUS

## 2022-02-16 MED ORDER — TETANUS-DIPHTH-ACELL PERTUSSIS 5-2.5-18.5 LF-MCG/0.5 IM SUSY
0.5000 mL | PREFILLED_SYRINGE | Freq: Once | INTRAMUSCULAR | Status: AC
  Administered 2022-02-16: 0.5 mL via INTRAMUSCULAR
  Filled 2022-02-16: qty 0.5

## 2022-02-16 NOTE — Discharge Planning (Signed)
RNCM consulted regarding medication assistance of pt unable to afford medications.  RNCM utilized [facility] petty cash to cover $16 co-pay and filled through [facility].  Rx e-scribed to [facility] and delivered to pt prior to discharge home.  No further RNCM needs identified at this time.
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
## 2022-02-16 NOTE — Discharge Instructions (Signed)
Recommend soap and water twice daily to your wounds.  Make sure wounds are dry and then can place Band-Aids or Ace wrap around the area.  Wear knee immobilizer as needed for comfort.  Use crutches or wheelchair for comfort.  Activity as tolerated.  Follow-up with Dr. Eulah Pont with orthopedics.  If you develop any fever or signs of infection please return for evaluation.  I recommend 1000 mg of Tylenol every 6 hours as needed for pain.  I recommend 800 mg ibuprofen every 8 hours as needed for pain.  I have written you for oxycodone for breakthrough pain.  Please take as prescribed.  This medication is sedating and do not mix with alcohol or other dangerous activities including driving.  I have also written you for antibiotics to help prevent infection called Keflex.  Take as prescribed.

## 2022-02-16 NOTE — ED Notes (Signed)
Wounds cleaned, dressing applied, and ace wrap applied to affected area

## 2022-02-16 NOTE — Progress Notes (Signed)
Orthopedic Tech Progress Note Patient Details:  Philip Lopez [birth year] [id number]  Knee immobilizer placed on RLE, crutches are adjusted and at bedside for use when PT comes to work with the pt.   Ortho Devices Type of Ortho Device: Knee Immobilizer, Crutches Ortho Device/Splint Location: RLE, crutches adjusted at bedside Ortho Device/Splint Interventions: Ordered, Application, Adjustment   Post Interventions Patient Tolerated: Well Instructions Provided: Care of device, Adjustment of device, Poper ambulation with device  Philip Lopez Carmine Savoy 02/16/2022, 9:48 AM
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
## 2022-02-16 NOTE — ED Notes (Signed)
US tech at bedside

## 2022-02-16 NOTE — Discharge Planning (Signed)
RNCM met with pt and family at bedside regarding discharge planning.  Pt will discharge with wheelchair home with self care.

## 2022-02-16 NOTE — Discharge Planning (Signed)
Oletta Cohn, RN, BSN, [place] [phone] Pt qualifies for DME [facility]) rolling walker.  DME  ordered through [place].  Zollie Beckers of [employer] notified to deliver DME to pt room prior to D/C home.

## 2022-02-16 NOTE — Discharge Planning (Signed)
    Durable Medical Equipment  (From admission, onward)           Start     Ordered   02/16/22 0945  For home use only DME wheelchair cushion (seat and back)  Once        02/16/22 0944           Pt failed transporting with rolling walker; wheelchair ordered.
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
## 2022-02-16 NOTE — Progress Notes (Signed)
Orthopedic Tech Progress Note Patient Details:  Philip Lopez 2000-07-13 [id number]  Patient ID: Philip Lopez, male   DOB: January 10, 2001, 21 y.o.   MRN: [id number] Level II; not currently needed. Darleen Crocker 02/16/2022, 7:14 AM

## 2022-02-17 ENCOUNTER — Encounter (HOSPITAL_COMMUNITY): Payer: Self-pay | Admitting: Emergency Medicine

## 2022-02-22 ENCOUNTER — Telehealth: Payer: Self-pay | Admitting: *Deleted

## 2022-02-22 NOTE — Telephone Encounter (Signed)
Pt called regarding refilling pain medication via telephone.  Pt did not leave full phone number; RNCM returned call to pt dad.  RNCM advised that pt will have to visit urgent care or ER for pain Rx (narcotic) refill.

## 2022-03-02 ENCOUNTER — Encounter (HOSPITAL_BASED_OUTPATIENT_CLINIC_OR_DEPARTMENT_OTHER): Payer: Self-pay | Admitting: Emergency Medicine

## 2022-03-02 ENCOUNTER — Emergency Department (HOSPITAL_BASED_OUTPATIENT_CLINIC_OR_DEPARTMENT_OTHER)
Admission: EM | Admit: 2022-03-02 | Discharge: 2022-03-03 | Discharge status: Against Medical Advice | Payer: Medicaid Other | Attending: Emergency Medicine | Admitting: Emergency Medicine

## 2022-03-02 DIAGNOSIS — M79661 Pain in right lower leg: Secondary | ICD-10-CM | POA: Diagnosis not present

## 2022-03-02 DIAGNOSIS — Z5321 Procedure and treatment not carried out due to patient leaving prior to being seen by health care provider: Secondary | ICD-10-CM | POA: Diagnosis not present
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
## 2022-03-19 DIAGNOSIS — R262 Difficulty in walking, not elsewhere classified: Secondary | ICD-10-CM | POA: Diagnosis not present

## 2022-04-19 DIAGNOSIS — R262 Difficulty in walking, not elsewhere classified: Secondary | ICD-10-CM | POA: Diagnosis not present

## 2022-05-18 DIAGNOSIS — R262 Difficulty in walking, not elsewhere classified: Secondary | ICD-10-CM | POA: Diagnosis not present

## 2022-06-18 DIAGNOSIS — R262 Difficulty in walking, not elsewhere classified: Secondary | ICD-10-CM | POA: Diagnosis not present

## 2022-07-18 DIAGNOSIS — R262 Difficulty in walking, not elsewhere classified: Secondary | ICD-10-CM | POA: Diagnosis not present

## 2022-07-19 DIAGNOSIS — R1013 Epigastric pain: Secondary | ICD-10-CM | POA: Diagnosis not present

## 2022-07-19 DIAGNOSIS — R112 Nausea with vomiting, unspecified: Secondary | ICD-10-CM | POA: Diagnosis not present

## 2022-08-16 DIAGNOSIS — Z113 Encounter for screening for infections with a predominantly sexual mode of transmission: Secondary | ICD-10-CM | POA: Diagnosis not present

## 2022-08-16 DIAGNOSIS — R369 Urethral discharge, unspecified: Secondary | ICD-10-CM | POA: Diagnosis not present

## 2022-08-18 DIAGNOSIS — R262 Difficulty in walking, not elsewhere classified: Secondary | ICD-10-CM | POA: Diagnosis not present

## 2022-09-17 DIAGNOSIS — R262 Difficulty in walking, not elsewhere classified: Secondary | ICD-10-CM | POA: Diagnosis not present

## 2022-10-18 DIAGNOSIS — R262 Difficulty in walking, not elsewhere classified: Secondary | ICD-10-CM | POA: Diagnosis not present

## 2022-11-18 DIAGNOSIS — R262 Difficulty in walking, not elsewhere classified: Secondary | ICD-10-CM | POA: Diagnosis not present

## 2022-11-23 DIAGNOSIS — Z1152 Encounter for screening for COVID-19: Secondary | ICD-10-CM | POA: Diagnosis not present

## 2022-11-23 DIAGNOSIS — R1013 Epigastric pain: Secondary | ICD-10-CM | POA: Diagnosis not present

## 2022-11-23 DIAGNOSIS — R197 Diarrhea, unspecified: Secondary | ICD-10-CM | POA: Diagnosis not present

## 2022-11-29 DIAGNOSIS — R1013 Epigastric pain: Secondary | ICD-10-CM | POA: Diagnosis not present

## 2022-11-29 DIAGNOSIS — R197 Diarrhea, unspecified: Secondary | ICD-10-CM | POA: Diagnosis not present

## 2022-11-29 DIAGNOSIS — R1110 Vomiting, unspecified: Secondary | ICD-10-CM | POA: Diagnosis not present

## 2023-01-30 DIAGNOSIS — Z113 Encounter for screening for infections with a predominantly sexual mode of transmission: Secondary | ICD-10-CM | POA: Diagnosis not present

## 2023-08-27 DIAGNOSIS — E559 Vitamin D deficiency, unspecified: Secondary | ICD-10-CM | POA: Diagnosis not present

## 2023-08-27 DIAGNOSIS — Z7251 High risk heterosexual behavior: Secondary | ICD-10-CM | POA: Diagnosis not present

## 2023-08-27 DIAGNOSIS — G629 Polyneuropathy, unspecified: Secondary | ICD-10-CM | POA: Diagnosis not present

## 2023-08-27 DIAGNOSIS — R5383 Other fatigue: Secondary | ICD-10-CM | POA: Diagnosis not present

## 2023-08-27 DIAGNOSIS — Z1339 Encounter for screening examination for other mental health and behavioral disorders: Secondary | ICD-10-CM | POA: Diagnosis not present

## 2023-08-27 DIAGNOSIS — Z1331 Encounter for screening for depression: Secondary | ICD-10-CM | POA: Diagnosis not present

## 2023-08-27 DIAGNOSIS — Z131 Encounter for screening for diabetes mellitus: Secondary | ICD-10-CM | POA: Diagnosis not present

## 2023-08-27 DIAGNOSIS — Z1159 Encounter for screening for other viral diseases: Secondary | ICD-10-CM | POA: Diagnosis not present

## 2023-08-27 DIAGNOSIS — Z6821 Body mass index (BMI) 21.0-21.9, adult: Secondary | ICD-10-CM | POA: Diagnosis not present

## 2023-08-27 DIAGNOSIS — M79651 Pain in right thigh: Secondary | ICD-10-CM | POA: Diagnosis not present

## 2023-08-27 DIAGNOSIS — Z0000 Encounter for general adult medical examination without abnormal findings: Secondary | ICD-10-CM | POA: Diagnosis not present

## 2023-08-27 DIAGNOSIS — Z13220 Encounter for screening for lipoid disorders: Secondary | ICD-10-CM | POA: Diagnosis not present

## 2023-08-31 DIAGNOSIS — M79604 Pain in right leg: Secondary | ICD-10-CM | POA: Diagnosis not present

## 2023-09-13 DIAGNOSIS — M79605 Pain in left leg: Secondary | ICD-10-CM | POA: Diagnosis not present

## 2023-09-13 DIAGNOSIS — Z6821 Body mass index (BMI) 21.0-21.9, adult: Secondary | ICD-10-CM | POA: Diagnosis not present

## 2023-10-16 DIAGNOSIS — R030 Elevated blood-pressure reading, without diagnosis of hypertension: Secondary | ICD-10-CM | POA: Diagnosis not present

## 2023-10-16 DIAGNOSIS — M79605 Pain in left leg: Secondary | ICD-10-CM | POA: Diagnosis not present

## 2023-10-16 DIAGNOSIS — S71132S Puncture wound without foreign body, left thigh, sequela: Secondary | ICD-10-CM | POA: Diagnosis not present

## 2023-10-16 DIAGNOSIS — G629 Polyneuropathy, unspecified: Secondary | ICD-10-CM | POA: Diagnosis not present

## 2023-10-16 DIAGNOSIS — Z6821 Body mass index (BMI) 21.0-21.9, adult: Secondary | ICD-10-CM | POA: Diagnosis not present
